# Patient Record
Sex: Female | Born: 1995 | Race: White | Hispanic: No | Marital: Single | Smoking: Current every day smoker
Health system: Southern US, Community
[De-identification: ages and names within clinical notes are randomized; demographics above are authoritative.]

## PROBLEM LIST (undated history)

## (undated) DIAGNOSIS — F419 Anxiety disorder, unspecified: Secondary | ICD-10-CM

## (undated) DIAGNOSIS — F319 Bipolar disorder, unspecified: Secondary | ICD-10-CM

---
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
## 2015-01-24 ENCOUNTER — Other Ambulatory Visit (HOSPITAL_COMMUNITY): Payer: Self-pay | Admitting: Psychiatry

## 2015-02-23 ENCOUNTER — Other Ambulatory Visit (HOSPITAL_COMMUNITY): Payer: Self-pay | Admitting: Psychiatry

## 2015-02-25 ENCOUNTER — Other Ambulatory Visit (HOSPITAL_COMMUNITY): Payer: Self-pay | Admitting: Psychiatry

## 2015-03-10 ENCOUNTER — Other Ambulatory Visit (HOSPITAL_COMMUNITY): Payer: Self-pay | Admitting: Psychiatry

## 2016-08-21 ENCOUNTER — Encounter (HOSPITAL_COMMUNITY): Payer: Self-pay | Admitting: Emergency Medicine

## 2016-08-21 ENCOUNTER — Emergency Department (HOSPITAL_COMMUNITY)
Admission: EM | Admit: 2016-08-21 | Discharge: 2016-08-21 | Disposition: A | Discharge status: Home / Self Care | Payer: 59 | Attending: Emergency Medicine | Admitting: Emergency Medicine

## 2016-08-21 DIAGNOSIS — F410 Panic disorder [episodic paroxysmal anxiety] without agoraphobia: Secondary | ICD-10-CM | POA: Diagnosis not present

## 2016-08-21 DIAGNOSIS — R002 Palpitations: Secondary | ICD-10-CM | POA: Insufficient documentation

## 2016-08-21 DIAGNOSIS — F17200 Nicotine dependence, unspecified, uncomplicated: Secondary | ICD-10-CM | POA: Insufficient documentation

## 2016-08-21 DIAGNOSIS — F419 Anxiety disorder, unspecified: Secondary | ICD-10-CM | POA: Diagnosis present

## 2016-08-21 DIAGNOSIS — Z79899 Other long term (current) drug therapy: Secondary | ICD-10-CM | POA: Diagnosis not present

## 2016-08-21 MED ORDER — LORAZEPAM 1 MG PO TABS
1.0000 mg | ORAL_TABLET | Freq: Once | ORAL | Status: AC
  Administered 2016-08-21: 1 mg via ORAL
  Filled 2016-08-21: qty 1

## 2016-08-21 NOTE — ED Triage Notes (Signed)
Pt comes in with mom and boyfriend this evening after celebrating her sister's birthday this evening and she had a panic attack. Pt takes 2 xanax a day for condition. Pt reports only taking 1. Sees psychiatrist every 6 months. Feels anxious still at this time. Denies SI/HI. Pt states there has not been any particular reason, it has just been at random. Ambulatory. A&O x4.

## 2016-08-21 NOTE — ED Provider Notes (Signed)
[facility]-EMERGENCY DEPT Provider Note   CSN: [id number] Arrival date & time: 08/21/16  2036     History   Chief Complaint Chief Complaint  Patient presents with  . Panic Attack    HPI Judy Randolph is a 21 y.o. female with past medical history of social anxiety and bipolar who presents with a panic attack that occurred this evening. Patient reports that she went to a restaurant with her mom and boyfriend. She states that going out in public especially in crowded places is a trigger for anxiety. Patient states that she was sitting there and started feeling very anxious and nervous. She started having some palpitations, difficulty breathing, and felt like she was out of it. Mom and boyfriend were with  her the entire time and states that she did not lose consciousness or have a syncopal episode. They both deny any tonic-clonic movement. Patient denies any tongue laceration, urinary incontinence. Patient reports that these symptoms are consistent with her past episodes of panic attacks. She denies any new or changes in symptoms. Patient was brought in by mom because over concern that patient is continuing having recurrent anxiety attacks despite xanax use. She reports that she does see a primary care doctor and is prescribed Xanax for her anxiety. She also sees a psychiatrist every 6 months but does not have a theraplist. She usually takes Xanax bid but only took one this morning.  Patient denies any SI/HI, auditory or visual hallucinations, nausea/vomiting, dysuria, hematuria. She does occasionally smoke cigarettes. She denies any alcohol use, cocaine, heroin, marijuana use.   The history is provided by the patient.    Past Medical History:  Diagnosis Date  . Anxiety   . Bipolar disorder [facility])     Patient Active Problem List   Diagnosis Date Noted  . Attention deficit hyperactivity disorder (ADHD), predominantly inattentive type   . Bipolar I disorder, most recent episode depressed (HCC)  12/26/2014  . Attention deficit hyperactivity disorder (ADHD) 12/26/2014  . Benzodiazepine dependence, continuous (HCC) 12/26/2014  . MDD (major depressive disorder), recurrent severe, without psychosis (HCC) 12/25/2014    History reviewed. No pertinent surgical history.  OB History    No data available       Home Medications    Prior to Admission medications   Medication Sig Start Date End Date Taking? Authorizing Provider  ALPRAZolam Prudy Feeler) 1 MG tablet Take 1 tablet by mouth 2 (two) times daily. 08/17/16  Yes [provider]  amphetamine-dextroamphetamine (ADDERALL) 30 MG tablet Take 1 tablet by mouth daily. 08/19/16  Yes [provider]  divalproex (DEPAKOTE ER) 500 MG 24 hr tablet Take 1 tablet (500 mg total) by mouth 2 (two) times daily. Patient not taking: Reported on 08/21/2016 12/28/14   Beau Fanny, FNP  hydrOXYzine (ATARAX/VISTARIL) 25 MG tablet Take 1 tablet (25 mg total) by mouth 3 (three) times daily as needed for anxiety. Patient not taking: Reported on 08/21/2016 12/28/14   Beau Fanny, FNP  lamoTRIgine (LAMICTAL) 100 MG tablet Take 1 tablet (100 mg total) by mouth at bedtime. Patient not taking: Reported on 08/21/2016 12/28/14   Withrow, Everardo All, FNP  nicotine (NICODERM CQ - DOSED IN MG/24 HOURS) 21 mg/24hr patch Place 1 patch (21 mg total) onto the skin daily. Patient not taking: Reported on 08/21/2016 12/28/14   Beau Fanny, FNP  traZODone (DESYREL) 50 MG tablet TAKE 1 TABLET BY MOUTH AT BEDTIME AS NEEDED FOR SLEEP Patient not taking: Reported on 08/21/2016 01/24/15  Nelly Rout, MD    Family History Family History  Problem Relation Age of Onset  . Hyperlipidemia Mother   . Hypertension Mother   . Heart disease Maternal Uncle   . Diabetes Maternal Grandmother   . Heart disease Maternal Grandmother   . Cancer Maternal Grandfather     Social History Social History  Substance Use Topics  . Smoking status: Current Every Day Smoker    . Smokeless tobacco: Not on file  . Alcohol use Yes     Allergies   Patient has no known allergies.   Review of Systems Review of Systems  Constitutional: Negative for fever.  Respiratory: Positive for shortness of breath.   Cardiovascular: Positive for palpitations. Negative for chest pain.  Gastrointestinal: Negative for abdominal pain, nausea and vomiting.  Genitourinary: Negative for dysuria and hematuria.  Neurological: Negative for headaches.  Psychiatric/Behavioral: Negative for hallucinations, self-injury and suicidal ideas. The patient is nervous/anxious.      Physical Exam Updated Vital Signs BP 113/88 (BP Location: Left Arm)   Pulse (!) 111   Temp 98.3 F (36.8 C) (Oral)   Resp 16   Ht 5\' 1"  (1.549 m)   Wt 63 kg (139 lb)   LMP 08/14/2016   SpO2 99%   BMI 26.26 kg/m   Physical Exam  Constitutional: She appears well-developed and well-nourished.  Appears very anxious and is constantly moving around throughout exam but no acute distress  HENT:  Head: Normocephalic and atraumatic.  No tongue laceration  Eyes: Conjunctivae and EOM are normal. Right eye exhibits no discharge. Left eye exhibits no discharge. No scleral icterus.  Cardiovascular: Regular rhythm.  Tachycardia present.   Pulses:      Radial pulses are 2+ on the right side, and 2+ on the left side.       Dorsalis pedis pulses are 2+ on the right side, and 2+ on the left side.  Pulmonary/Chest: Effort normal.  No evidence of respiratory distress. Able to speak in full sentences without difficulty.  Neurological: She is alert.  Skin: Skin is warm and dry.  Psychiatric: Her speech is normal and behavior is normal. Her mood appears anxious. She expresses no homicidal and no suicidal ideation.  Very anxious throughout interview. Makes excellent eye contact. No evidence of pressured speech.   Nursing note and vitals reviewed.    ED Treatments / Results  Labs (all labs ordered are listed, but only  abnormal results are displayed) Labs Reviewed - No data to display  EKG  EKG Interpretation  Date/Time:  Wednesday August 21 2016 22:52:26 EDT Ventricular Rate:  102 PR Interval:    QRS Duration: 73 QT Interval:  308 QTC Calculation: 402 R Axis:   80 Text Interpretation:  Sinus tachycardia Nonspecific repol abnormality, diffuse leads Confirmed by Donnetta Hutching ([id number]) on 08/22/2016 1:01:14 PM       Radiology No results found.  Procedures Procedures (including critical care time)  Medications Ordered in ED Medications  LORazepam (ATIVAN) tablet 1 mg (1 mg Oral Given 08/21/16 2239)     Initial Impression / Assessment and Plan / ED Course  I have reviewed the triage vital signs and the nursing notes.  Pertinent labs & imaging results that were available during my care of the patient were reviewed by me and considered in my medical decision making (see chart for details).     21 y.o. F with history of anxiety who presents today for a panic attack and anxiety that occurred tonight at  a restaurant. Patient has a history of social anxiety and reports that one of her triggers is crowded places, which she thinks is what trigger today's episodes. Patient is afebrile, non-toxic appearing, anxious but in no acute distress. Vital signs reviewed. Patient is tachycardic, likely a result of the anxiety. No SI/HI. No auditory/visual hallucinations. Today's episodes is consistent with past episodes of panic attacks and anxiety. History/physical exam are not concerning for ACS or PE pathology. Discussed at length with patient and mom and engaged in shared decision making. Given that patient's symptoms are consistent with her past episodes and patient is not having any SI/HI, she does not need medical clearance with TTS consult right now. Mom does wish to find a therapist that patient can follow-up with on an outpatient basis. Agree that this is reasonable and will plan to provide her with an outpatient  psych resource guide. Will obtain EKG since patient is having some palpitations. Anxiolytics given for anxiety.   EKG reviewed. Sinus tach, 102. Re-evaluation of patient. She is still anxious but states that the medication has made her feel better. Patient does feel safe to go home. She lives at home with mom and her boyfriend. Patient is stable for discharge at this time. Outpatient psych resource guide provided to patient. Return precautions discussed. Patient expresses understanding and agreement to plan.      Final Clinical Impressions(s) / ED Diagnoses   Final diagnoses:  Panic attack  Anxiety    New Prescriptions Discharge Medication List as of 08/21/2016 11:14 PM       Maxwell CaulLayden, Avigdor Dollar A, PA-C 08/22/16 2216    Lorre NickAllen, Anthony, MD 08/23/16 2250

## 2016-08-21 NOTE — ED Notes (Signed)
[facility] Expected date:  Expected time:  Means of arrival:  Comments: [place]

## 2016-08-21 NOTE — Discharge Instructions (Signed)
Follow-up to her primary care doctor in the next 24-48 hours for further evaluation.  Use the referred resources guide to find a counselor to follow-up with. If he cannot find one, as her primary care doctor for further evaluation and referral.  Do not take any Xanax tonight.  Return the emergency per for any worsening symptoms and anxiety, chest pain, difficult breathing, thought 20 and her tachycardia himself, felt 20-20 pills or any other worsening or concerning symptoms.
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
## 2018-07-28 ENCOUNTER — Encounter (HOSPITAL_COMMUNITY): Payer: Self-pay | Admitting: Psychiatry

## 2018-07-28 ENCOUNTER — Ambulatory Visit (INDEPENDENT_AMBULATORY_CARE_PROVIDER_SITE_OTHER): Payer: 59 | Admitting: Psychiatry

## 2018-07-28 ENCOUNTER — Other Ambulatory Visit: Payer: Self-pay

## 2018-07-28 DIAGNOSIS — F1311 Sedative, hypnotic or anxiolytic abuse, in remission: Secondary | ICD-10-CM

## 2018-07-28 DIAGNOSIS — Z7289 Other problems related to lifestyle: Secondary | ICD-10-CM | POA: Diagnosis not present

## 2018-07-28 DIAGNOSIS — Z789 Other specified health status: Secondary | ICD-10-CM

## 2018-07-28 DIAGNOSIS — F4310 Post-traumatic stress disorder, unspecified: Secondary | ICD-10-CM | POA: Diagnosis not present

## 2018-07-28 DIAGNOSIS — F0630 Mood disorder due to known physiological condition, unspecified: Secondary | ICD-10-CM | POA: Diagnosis not present

## 2018-07-28 MED ORDER — DIVALPROEX SODIUM ER 500 MG PO TB24
500.0000 mg | ORAL_TABLET | Freq: Every day | ORAL | 0 refills | Status: DC
Start: 2018-07-28 — End: 2018-09-30

## 2018-07-28 MED ORDER — ESCITALOPRAM OXALATE 10 MG PO TABS: ORAL_TABLET | ORAL | 0 refills | Status: DC

## 2018-07-28 MED ORDER — LAMOTRIGINE 150 MG PO TABS
150.0000 mg | ORAL_TABLET | Freq: Every day | ORAL | 0 refills | Status: DC
Start: 2018-07-28 — End: 2018-08-26

## 2018-07-28 NOTE — Patient Instructions (Signed)
1. Continue depakote 500 mg at night  2. Continue lamotrigine 150 mg daily  3. Start lexapro 5 mg daily for one week, then 10 mg daily  4. Referral to therapy  5. Check labs: valproic acid, LFT, CBC, TSH
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
## 2018-08-19 ENCOUNTER — Ambulatory Visit (HOSPITAL_COMMUNITY): Payer: 59 | Admitting: Psychiatry

## 2018-08-25 ENCOUNTER — Other Ambulatory Visit: Payer: Self-pay

## 2018-08-25 ENCOUNTER — Ambulatory Visit (HOSPITAL_COMMUNITY): Payer: 59 | Admitting: Psychiatry

## 2018-08-25 ENCOUNTER — Telehealth (HOSPITAL_COMMUNITY): Payer: Self-pay | Admitting: Psychiatry

## 2018-08-25 NOTE — Telephone Encounter (Signed)
She states that she could not do the visit today due to work and forgot to cancel the appointment yesterday.  - Reschedule to 7/23 at 9 AM for 30 mins, video

## 2018-08-26 ENCOUNTER — Other Ambulatory Visit (HOSPITAL_COMMUNITY): Payer: Self-pay | Admitting: Psychiatry

## 2018-08-26 MED ORDER — LAMOTRIGINE 150 MG PO TABS: 150.0000 mg | ORAL_TABLET | Freq: Every day | ORAL | 0 refills | Status: AC
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
## 2018-08-28 ENCOUNTER — Ambulatory Visit (HOSPITAL_COMMUNITY): Payer: 59 | Admitting: Psychiatry

## 2018-08-31 ENCOUNTER — Ambulatory Visit (HOSPITAL_COMMUNITY): Payer: 59 | Admitting: Psychiatry

## 2018-09-03 ENCOUNTER — Ambulatory Visit (HOSPITAL_COMMUNITY): Payer: 59 | Admitting: Psychiatry

## 2018-09-03 ENCOUNTER — Telehealth (HOSPITAL_COMMUNITY): Payer: Self-pay | Admitting: Psychiatry

## 2018-09-03 ENCOUNTER — Other Ambulatory Visit: Payer: Self-pay

## 2018-09-03 NOTE — Telephone Encounter (Signed)
Sent link for video visit through [facility] [facility]. Patient did not sign in. Called the patient  twice for appointment scheduled today. The patient did not answer the phone. Left voice message to contact the office.  

## 2018-09-03 NOTE — Telephone Encounter (Signed)
Correction to the previous telephone note: there was no option to leave voice message.

## 2018-09-30 ENCOUNTER — Other Ambulatory Visit (HOSPITAL_COMMUNITY): Payer: Self-pay | Admitting: Psychiatry

## 2018-09-30 ENCOUNTER — Telehealth (HOSPITAL_COMMUNITY): Payer: Self-pay | Admitting: *Deleted

## 2018-09-30 DIAGNOSIS — F0630 Mood disorder due to known physiological condition, unspecified: Secondary | ICD-10-CM

## 2018-09-30 MED ORDER — ESCITALOPRAM OXALATE 10 MG PO TABS: 10.0000 mg | ORAL_TABLET | Freq: Every day | ORAL | 1 refills | Status: AC

## 2018-09-30 MED ORDER — DIVALPROEX SODIUM ER 500 MG PO TB24: 500.0000 mg | ORAL_TABLET | Freq: Every day | ORAL | 1 refills | Status: AC

## 2018-09-30 NOTE — Telephone Encounter (Signed)
Ordered medication. Please also remind her that she needs to get blood test to continue depakote.  Ask her which labs she would like me to send an order.

## 2018-09-30 NOTE — Telephone Encounter (Signed)
Orders sent to Judy Randolph. Please advise her to obtain blood test at least five days after she consistently takes valproic acid (Depakote).

## 2018-09-30 NOTE — Telephone Encounter (Signed)
PATIENT CALLED REQUESTED REFILL ON [place]. NEXT APPT 11/06/18  INFORMED OF NO SHOW POLICY

## 2018-09-30 NOTE — Telephone Encounter (Signed)
SPOKE WITH PATIENT & [facility] IN [place] IS CLOSET TO HER HOME

## 2018-09-30 NOTE — Telephone Encounter (Signed)
lvm per provider: Orders sent to [facility]. Please advise her to obtain blood test at least five days after she consistently takes valproic acid (Depakote).
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
## 2018-11-06 ENCOUNTER — Other Ambulatory Visit: Payer: Self-pay

## 2018-11-06 ENCOUNTER — Telehealth (HOSPITAL_COMMUNITY): Payer: Self-pay | Admitting: Psychiatry

## 2018-11-06 ENCOUNTER — Ambulatory Visit (HOSPITAL_COMMUNITY): Payer: 59 | Admitting: Psychiatry

## 2018-11-06 ENCOUNTER — Encounter (HOSPITAL_COMMUNITY): Payer: Self-pay

## 2018-11-06 NOTE — Telephone Encounter (Signed)
Sent link for video visit through [employer] [facility]. Patient did not sign in. Called the patient  twice for appointment scheduled today. The patient did not answer the phone. Voice message was full, and unable to leave a message.

## 2019-03-15 DEATH — deceased
